# Patient Record
Sex: Female | Born: 1995 | Race: White | Hispanic: No | Marital: Single | Smoking: Never smoker
Health system: Southern US, Community
[De-identification: ages and names within clinical notes are randomized; demographics above are authoritative.]

## PROBLEM LIST (undated history)

## (undated) DIAGNOSIS — E119 Type 2 diabetes mellitus without complications: Secondary | ICD-10-CM

## (undated) DIAGNOSIS — E039 Hypothyroidism, unspecified: Secondary | ICD-10-CM

## (undated) HISTORY — DX: Type 2 diabetes mellitus without complications: E11.9

## (undated) SURGERY — Surgical Case
Anesthesia: *Unknown

---

## 2014-09-24 ENCOUNTER — Emergency Department
Admission: EM | Admit: 2014-09-24 | Discharge: 2014-09-24 | Disposition: A | Discharge status: Home / Self Care | Payer: 59 | Attending: Emergency Medicine | Admitting: Emergency Medicine

## 2014-09-24 ENCOUNTER — Encounter: Payer: Self-pay | Admitting: Emergency Medicine

## 2014-09-24 DIAGNOSIS — F10120 Alcohol abuse with intoxication, uncomplicated: Secondary | ICD-10-CM | POA: Insufficient documentation

## 2014-09-24 DIAGNOSIS — E119 Type 2 diabetes mellitus without complications: Secondary | ICD-10-CM | POA: Insufficient documentation

## 2014-09-24 DIAGNOSIS — Z794 Long term (current) use of insulin: Secondary | ICD-10-CM | POA: Insufficient documentation

## 2014-09-24 DIAGNOSIS — F10920 Alcohol use, unspecified with intoxication, uncomplicated: Secondary | ICD-10-CM

## 2014-09-24 HISTORY — DX: Type 2 diabetes mellitus without complications: E11.9

## 2014-09-24 LAB — ETHANOL: ALCOHOL ETHYL (B): 272 mg/dL — AB (ref <5)

## 2014-09-24 LAB — GLUCOSE, CAPILLARY: GLUCOSE-CAPILLARY: 176 mg/dL — AB (ref 65–99)

## 2014-09-24 MED ORDER — SODIUM CHLORIDE 0.9 % IV BOLUS (SEPSIS)
1000.0000 mL | Freq: Once | INTRAVENOUS | Status: AC
  Administered 2014-09-24: 1000 mL via INTRAVENOUS

## 2014-09-24 MED ORDER — ONDANSETRON HCL 4 MG/2ML IJ SOLN
4.0000 mg | Freq: Once | INTRAMUSCULAR | Status: AC
  Administered 2014-09-24: 4 mg via INTRAVENOUS
  Filled 2014-09-24: qty 2

## 2014-09-24 NOTE — Discharge Instructions (Signed)
Alcohol Intoxication  Alcohol intoxication occurs when the amount of alcohol that a person has consumed impairs his or her ability to mentally and physically function. Alcohol directly impairs the normal chemical activity of the brain. Drinking large amounts of alcohol can lead to changes in mental function and behavior, and it can cause many physical effects that can be harmful.   Alcohol intoxication can range in severity from mild to very severe. Various factors can affect the level of intoxication that occurs, such as the person's age, gender, weight, frequency of alcohol consumption, and the presence of other medical conditions (such as diabetes, seizures, or heart conditions). Dangerous levels of alcohol intoxication may occur when people drink large amounts of alcohol in a short period (binge drinking). Alcohol can also be especially dangerous when combined with certain prescription medicines or "recreational" drugs.  SIGNS AND SYMPTOMS  Some common signs and symptoms of mild alcohol intoxication include:  · Loss of coordination.  · Changes in mood and behavior.  · Impaired judgment.  · Slurred speech.  As alcohol intoxication progresses to more severe levels, other signs and symptoms will appear. These may include:  · Vomiting.  · Confusion and impaired memory.  · Slowed breathing.  · Seizures.  · Loss of consciousness.  DIAGNOSIS   Your health care provider will take a medical history and perform a physical exam. You will be asked about the amount and type of alcohol you have consumed. Blood tests will be done to measure the concentration of alcohol in your blood. In many places, your blood alcohol level must be lower than 80 mg/dL (0.08%) to legally drive. However, many dangerous effects of alcohol can occur at much lower levels.   TREATMENT   People with alcohol intoxication often do not require treatment. Most of the effects of alcohol intoxication are temporary, and they go away as the alcohol naturally  leaves the body. Your health care provider will monitor your condition until you are stable enough to go home. Fluids are sometimes given through an IV access tube to help prevent dehydration.   HOME CARE INSTRUCTIONS  · Do not drive after drinking alcohol.  · Stay hydrated. Drink enough water and fluids to keep your urine clear or pale yellow. Avoid caffeine.    · Only take over-the-counter or prescription medicines as directed by your health care provider.    SEEK MEDICAL CARE IF:   · You have persistent vomiting.    · You do not feel better after a few days.  · You have frequent alcohol intoxication. Your health care provider can help determine if you should see a substance use treatment counselor.  SEEK IMMEDIATE MEDICAL CARE IF:   · You become shaky or tremble when you try to stop drinking.    · You shake uncontrollably (seizure).    · You throw up (vomit) blood. This may be bright red or may look like black coffee grounds.    · You have blood in your stool. This may be bright red or may appear as a black, tarry, bad smelling stool.    · You become lightheaded or faint.    MAKE SURE YOU:   · Understand these instructions.  · Will watch your condition.  · Will get help right away if you are not doing well or get worse.  Document Released: 10/25/2004 Document Revised: 09/17/2012 Document Reviewed: 06/20/2012  Exit[employer]® Patient Information ©2015 [employer]. This information is not intended to replace advice given to you by your health care provider. Make sure   you discuss any questions you have with your health care provider.

## 2014-09-24 NOTE — ED Provider Notes (Signed)
[facility] Emergency Department Provider Note  ____________________________________________  Time seen: 1:30 AM  I have reviewed the triage vital signs and the nursing notes.   HISTORY  Chief Complaint Alcohol Intoxication      HPI Kristine Garrett is a 19 y.o. female presents via EMS clinically intoxicated. Per EMS patient was found on the side of the road by [place]. Patient admits to large alcohol intake tonight. Patient denies any trauma. Patient denies any illicit drug use. Patient had nonbloody emesis 3 while in route with EMS.    Past Medical History  Diagnosis Date  . Diabetes mellitus without complication     There are no active problems to display for this patient.   History reviewed. No pertinent past surgical history.  Current Outpatient Rx  Name  Route  Sig  Dispense  Refill  . insulin aspart (NOVOLOG) 100 UNIT/ML injection   Subcutaneous   Inject into the skin 4 (four) times daily - after meals and at bedtime.           Allergies No known drug allergies History reviewed. No pertinent family history.  Social History Social History  Substance Use Topics  . Smoking status: Never Smoker   . Smokeless tobacco: Never Used  . Alcohol Use: Yes    Review of Systems  Constitutional: Negative for fever. Eyes: Negative for visual changes. ENT: Negative for sore throat. Cardiovascular: Negative for chest pain. Respiratory: Negative for shortness of breath. Gastrointestinal: Negative for abdominal pain, vomiting and diarrhea. Genitourinary: Negative for dysuria. Musculoskeletal: Negative for back pain. Skin: Negative for rash. Neurological: Negative for headaches, focal weakness or numbness.   10-point ROS otherwise negative.  ____________________________________________   PHYSICAL EXAM:  VITAL SIGNS: ED Triage Vitals  Enc Vitals Group     BP 09/24/14 0127 93/63 mmHg     Pulse Rate 09/24/14 0127 70     Resp 09/24/14  0127 18     Temp 09/24/14 0127 97.4 F (36.3 C)     Temp Source 09/24/14 0127 Axillary     SpO2 09/24/14 0127 97 %     Weight 09/24/14 0127 150 lb (68.04 kg)     Height 09/24/14 0127  (1.702 m)     Head Cir --      Peak Flow --      Pain Score 09/24/14 0128 0     Pain Loc --      Pain Edu? --      Excl. in GC? --     Constitutional: Alert, alcohol on breath nonbloody emesis noted in hair Eyes: Conjunctivae are normal. PERRL. Normal extraocular movements. ENT   Head: Normocephalic and atraumatic.   Nose: No congestion/rhinnorhea.   Mouth/Throat: Mucous membranes are moist.   Neck: No stridor. Cardiovascular: Normal rate, regular rhythm. Normal and symmetric distal pulses are present in all extremities. No murmurs, rubs, or gallops. Respiratory: Normal respiratory effort without tachypnea nor retractions. Breath sounds are clear and equal bilaterally. No wheezes/rales/rhonchi. Gastrointestinal: Soft and nontender. No distention. There is no CVA tenderness. Genitourinary: deferred Musculoskeletal: Nontender with normal range of motion in all extremities. No joint effusions.  No lower extremity tenderness nor edema. Neurologic:  Normal speech and language. No gross focal neurologic deficits are appreciated. Speech is normal.  Skin:  Skin is warm, dry and intact. No rash noted. Psychiatric: Clinically intoxicated.  ____________________________________________    LABS (pertinent positives/negatives)  Labs Reviewed  ETHANOL - Abnormal; Notable for the following:  Alcohol, Ethyl (B) 272 (*)    All other components within normal limits       INITIAL IMPRESSION / ASSESSMENT AND PLAN / ED COURSE  Pertinent labs & imaging results that were available during my care of the patient were reviewed by me and considered in my medical decision making (see chart for details).  I reevaluated the patient and again asked if there was any trauma any assault to which the  patient denied no physical evidence of assault during my examination. I asked patient if she wanted me to inform her family that she was in the emergency department to which she refused my offer to give the patient a phone so she could call her parents and let them know she was in the emergency department to which she also refused.  ____________________________________________   FINAL CLINICAL IMPRESSION(S) / ED DIAGNOSES  Final diagnoses:  Alcohol intoxication, uncomplicated      Darci Current, MD 09/24/14 [phone]

## 2014-09-24 NOTE — ED Notes (Signed)
Pt seen walking with friends from [place] in parking lot.  Pt informed not to drive.

## 2014-09-24 NOTE — ED Notes (Signed)
Pt to [facility] via EMS, report alcohol intoxication with vomiting x 3.  Pt reports ID-DM.  EMS report VS stable en route.  Pt lethargic but responsive to stimuli.

## 2014-09-24 NOTE — ED Notes (Signed)
Dr Mayford Knife at bedside, cleared pt to be DC, pt A&O x4, amb w/o assist. Pt walked to lobby w/o diff.

## 2015-03-05 DIAGNOSIS — M79651 Pain in right thigh: Secondary | ICD-10-CM | POA: Diagnosis not present

## 2015-03-05 DIAGNOSIS — M545 Low back pain: Secondary | ICD-10-CM | POA: Diagnosis not present

## 2015-03-05 DIAGNOSIS — M79652 Pain in left thigh: Secondary | ICD-10-CM | POA: Diagnosis not present

## 2015-03-08 ENCOUNTER — Ambulatory Visit
Admission: RE | Admit: 2015-03-08 | Discharge: 2015-03-08 | Disposition: A | Discharge status: Home / Self Care | Payer: BLUE CROSS/BLUE SHIELD | Source: Ambulatory Visit | Attending: Family Medicine | Admitting: Family Medicine

## 2015-03-08 ENCOUNTER — Other Ambulatory Visit: Payer: Self-pay | Admitting: Family Medicine

## 2015-03-08 DIAGNOSIS — R52 Pain, unspecified: Secondary | ICD-10-CM

## 2015-03-08 DIAGNOSIS — G729 Myopathy, unspecified: Secondary | ICD-10-CM | POA: Diagnosis present

## 2015-03-08 DIAGNOSIS — Z9641 Presence of insulin pump (external) (internal): Secondary | ICD-10-CM | POA: Insufficient documentation

## 2015-03-08 DIAGNOSIS — M545 Low back pain: Secondary | ICD-10-CM | POA: Insufficient documentation

## 2015-03-08 DIAGNOSIS — M6289 Other specified disorders of muscle: Secondary | ICD-10-CM

## 2015-03-14 ENCOUNTER — Other Ambulatory Visit: Payer: Self-pay | Admitting: Family Medicine

## 2015-03-14 DIAGNOSIS — M545 Low back pain: Secondary | ICD-10-CM

## 2015-03-16 ENCOUNTER — Ambulatory Visit
Admission: RE | Admit: 2015-03-16 | Discharge: 2015-03-16 | Disposition: A | Discharge status: Home / Self Care | Payer: BLUE CROSS/BLUE SHIELD | Source: Ambulatory Visit | Attending: Family Medicine | Admitting: Family Medicine

## 2015-03-16 DIAGNOSIS — M5137 Other intervertebral disc degeneration, lumbosacral region: Secondary | ICD-10-CM | POA: Insufficient documentation

## 2015-03-16 DIAGNOSIS — M5440 Lumbago with sciatica, unspecified side: Secondary | ICD-10-CM | POA: Diagnosis present

## 2015-03-16 DIAGNOSIS — M545 Low back pain: Secondary | ICD-10-CM | POA: Diagnosis present

## 2015-03-30 ENCOUNTER — Ambulatory Visit: Payer: 59

## 2015-04-28 ENCOUNTER — Other Ambulatory Visit: Payer: Self-pay | Admitting: Family Medicine

## 2015-04-28 ENCOUNTER — Ambulatory Visit
Admission: RE | Admit: 2015-04-28 | Discharge: 2015-04-28 | Disposition: A | Discharge status: Home / Self Care | Payer: BLUE CROSS/BLUE SHIELD | Source: Ambulatory Visit | Attending: Family Medicine | Admitting: Family Medicine

## 2015-04-28 DIAGNOSIS — M25551 Pain in right hip: Secondary | ICD-10-CM | POA: Diagnosis not present

## 2015-04-28 DIAGNOSIS — R52 Pain, unspecified: Secondary | ICD-10-CM

## 2015-05-03 ENCOUNTER — Encounter: Payer: Self-pay | Admitting: Family Medicine

## 2015-05-03 ENCOUNTER — Ambulatory Visit (INDEPENDENT_AMBULATORY_CARE_PROVIDER_SITE_OTHER): Payer: BLUE CROSS/BLUE SHIELD | Admitting: Family Medicine

## 2015-05-03 DIAGNOSIS — M25551 Pain in right hip: Secondary | ICD-10-CM | POA: Diagnosis not present

## 2015-05-03 NOTE — Progress Notes (Signed)
Patient ID: Kristine Garrett, female   DOB: 04/07/1995, 20 y.o.   MRN: [id number]  She presents today with symptoms of right hip pain. Patient states that she has noticed this pain for the last week. Most of her pain is with running. Patient also has had some chronic back pain which is better now than when it first started in January. Her MRI of her lower back was not significant for any large disc herniation. Patient has a history of a stress injury to both hips in the past. X-rays of the hip do not show any obvious stress fracture. 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 She denies any pain with walking or at rest. She states that the symptoms that she is having in her hip at this time are similar to when she had her stress injury a few years ago. Patient is also a type I diabetic and has hypothyroidism. She states her blood sugars are under well control and her last A1c was 7.5. She denies any restrictive diet. She does not know her vitamin D status. She admits to irregular menstrual cycles.  ROS: Negative except mentioned above.  Vitals as per Epic. GENERAL: NAD RESP: CTA B CARD: RRR MSK: no tenderness on palpation of the groin/hip, patient points to right inquinal area extending into proximal femur, limited external rotation bilaterally, no pain with internal rotation, negative log roll, mild tenderness right lower lumbar paravertebral area, normal flexion, mild discomfort with extension, -SLR, +R Fulcrum, FROM of LEs, nv intact  NEURO: CN II-XII grossly intact   A/P: R Hip Pain-given patient's symptoms and history would recommend proceeding with MRI of the right hip. I have asked the patient to avoid any activity that causes her pain. If any worsening of symptoms she is to let her trainer know. X-rays of her hip were reviewed with her in the office today. Of note there was evidence of osteitis pubis however patient has no pain in this area. Will discuss plan of  care after MRI has been done. Vitamin D level and calcium level were checked today.

## 2015-05-03 NOTE — Addendum Note (Signed)
Addended by: Dione HousekeeperPATEL, Shaddai Shapley N on: 05/03/2015 10:11 AM   Modules accepted: Orders

## 2015-05-04 LAB — SPECIMEN STATUS

## 2015-05-04 LAB — BASIC METABOLIC PANEL
BUN/Creatinine Ratio: 21 (ref 9–23)
BUN: 14 mg/dL (ref 6–20)
CALCIUM: 9.5 mg/dL (ref 8.7–10.2)
CO2: 21 mmol/L (ref 18–29)
Chloride: 102 mmol/L (ref 96–106)
Creatinine, Ser: 0.66 mg/dL (ref 0.57–1.00)
GFR, EST AFRICAN AMERICAN: 147 mL/min/{1.73_m2} (ref >59)
GFR, EST NON AFRICAN AMERICAN: 128 mL/min/{1.73_m2} (ref >59)
Glucose: 78 mg/dL (ref 65–99)
Potassium: 4.5 mmol/L (ref 3.5–5.2)
Sodium: 142 mmol/L (ref 134–144)

## 2015-05-04 LAB — VITAMIN D 25 HYDROXY (VIT D DEFICIENCY, FRACTURES): Vit D, 25-Hydroxy: 46 ng/mL (ref 30.0–100.0)

## 2015-05-05 ENCOUNTER — Other Ambulatory Visit: Payer: Self-pay | Admitting: Family Medicine

## 2015-05-05 DIAGNOSIS — M25551 Pain in right hip: Secondary | ICD-10-CM

## 2015-05-20 ENCOUNTER — Ambulatory Visit: Payer: BLUE CROSS/BLUE SHIELD

## 2015-05-23 ENCOUNTER — Ambulatory Visit
Admission: RE | Admit: 2015-05-23 | Discharge: 2015-05-23 | Disposition: A | Discharge status: Home / Self Care | Payer: BLUE CROSS/BLUE SHIELD | Source: Ambulatory Visit | Attending: Family Medicine | Admitting: Family Medicine

## 2015-05-23 DIAGNOSIS — M25551 Pain in right hip: Secondary | ICD-10-CM | POA: Insufficient documentation

## 2015-10-10 ENCOUNTER — Ambulatory Visit (INDEPENDENT_AMBULATORY_CARE_PROVIDER_SITE_OTHER): Payer: BLUE CROSS/BLUE SHIELD | Admitting: Family Medicine

## 2015-10-10 ENCOUNTER — Encounter: Payer: Self-pay | Admitting: Family Medicine

## 2015-10-10 DIAGNOSIS — M79606 Pain in leg, unspecified: Secondary | ICD-10-CM

## 2015-10-10 NOTE — Progress Notes (Signed)
Patient presents today for follow-up regarding her tightness in bilateral quadriceps. Patient has had the symptoms for the last few years. Her symptoms have gotten worse while she has been in [employer]. Workup has been done to see if the etiology of her pain is from her lower back. She is also seen Dr. Ardine Engiehl her orthopedic surgeon. There was some thought over the summer her that we should test her compartment pressures to see if she has an exertional compartment syndrome. I did speak with Dr. Wyn Quakerew the vascular surgeon at [facility] who was willing to do compartment pressures on her. Patient has never had to take herself out of practice or a game due to her quadriceps tightness. She has not had any tingling numbness or discoloration of her quadriceps. Patient does understand that if there are elevated pressures in the quadriceps that a fasciotomy could be done to help relieve her symptoms but there is no guarantee that this would not cause any other complications or would alleviate fully her symptoms. Patient would just like to know the reason for why she has the tightness in her quadriceps. The tightness does not occur outside of running. Will set up appointment for patient to see Dr. Wyn Quakerew for an evaluation and to set up a appointment to have compartment pressures measured.

## 2015-10-14 ENCOUNTER — Other Ambulatory Visit: Payer: Self-pay | Admitting: Family Medicine

## 2015-10-14 DIAGNOSIS — M79659 Pain in unspecified thigh: Secondary | ICD-10-CM

## 2015-11-03 ENCOUNTER — Encounter (INDEPENDENT_AMBULATORY_CARE_PROVIDER_SITE_OTHER): Payer: Self-pay

## 2015-11-07 ENCOUNTER — Other Ambulatory Visit (INDEPENDENT_AMBULATORY_CARE_PROVIDER_SITE_OTHER): Payer: Self-pay | Admitting: Vascular Surgery

## 2015-11-08 ENCOUNTER — Other Ambulatory Visit (INDEPENDENT_AMBULATORY_CARE_PROVIDER_SITE_OTHER): Payer: Self-pay | Admitting: Vascular Surgery

## 2015-11-14 ENCOUNTER — Ambulatory Visit
Admission: RE | Admit: 2015-11-14 | Discharge: 2015-11-14 | Disposition: A | Discharge status: Home / Self Care | Payer: BLUE CROSS/BLUE SHIELD | Source: Ambulatory Visit | Attending: Vascular Surgery | Admitting: Vascular Surgery

## 2015-11-14 ENCOUNTER — Encounter
Admission: RE | Disposition: A | Discharge status: Home / Self Care | Payer: Self-pay | Source: Ambulatory Visit | Attending: Vascular Surgery

## 2015-11-14 ENCOUNTER — Encounter: Payer: Self-pay | Admitting: *Deleted

## 2015-11-14 DIAGNOSIS — M79604 Pain in right leg: Secondary | ICD-10-CM

## 2015-11-14 DIAGNOSIS — M79652 Pain in left thigh: Secondary | ICD-10-CM | POA: Insufficient documentation

## 2015-11-14 DIAGNOSIS — E039 Hypothyroidism, unspecified: Secondary | ICD-10-CM | POA: Diagnosis not present

## 2015-11-14 DIAGNOSIS — Z794 Long term (current) use of insulin: Secondary | ICD-10-CM | POA: Diagnosis not present

## 2015-11-14 DIAGNOSIS — E119 Type 2 diabetes mellitus without complications: Secondary | ICD-10-CM | POA: Insufficient documentation

## 2015-11-14 DIAGNOSIS — M79651 Pain in right thigh: Secondary | ICD-10-CM | POA: Insufficient documentation

## 2015-11-14 DIAGNOSIS — M79605 Pain in left leg: Secondary | ICD-10-CM

## 2015-11-14 HISTORY — PX: PERIPHERAL VASCULAR CATHETERIZATION: SHX172C

## 2015-11-14 HISTORY — DX: Hypothyroidism, unspecified: E03.9

## 2015-11-14 SURGERY — LOWER EXTREMITY VENOGRAPHY
Anesthesia: Moderate Sedation

## 2015-11-14 MED ORDER — LIDOCAINE HCL (PF) 1 % IJ SOLN
INTRAMUSCULAR | Status: AC
Start: 2015-11-14 — End: 2015-11-14
  Filled 2015-11-14: qty 30

## 2015-11-14 SURGICAL SUPPLY — 2 items
KIT RIGHT HEART (MISCELLANEOUS) ×3 IMPLANT
NEEDLE COAXIAL INTRO 17GX11.8 (NEEDLE) ×6 IMPLANT

## 2015-11-14 NOTE — Op Note (Signed)
[facility]   OPERATIVE NOTE  DATE: 11/14/2015  PRE-OPERATIVE DIAGNOSIS: Bilateral lower extremity pain worrisome for thigh compartment syndrome  POST-OPERATIVE DIAGNOSIS: Same as above  PROCEDURE: 1.   Bilateral thigh compartment pressure measurements with and without exercise  SURGEON: Festus BarrenJason Asma Boldon  ASSISTANT(S): None  ANESTHESIA: Local  ESTIMATED BLOOD LOSS: Minimal  FINDING(S): 1.  Right thigh pressure measurements were 2 without exercise and went up to 7 with exercise. Left thigh pressure measurements were 1-2 without exercise and went up to 8 with exercise.  SPECIMEN(S):  None  INDICATIONS:   Kristine NovasShelby Gassert is a 20 y.o. female who presents with bilateral lower extremity pain worrisome for compartment syndrome of the thigh. The pain develops at vigorous activity and is generally relieved with rest. She has had other imaging studies and evaluation and no other clear cause of her pain has been found, and she was referred for compartment pressure measurements for further evaluation. Risks and benefits were discussed and informed consent was obtained.  DESCRIPTION: After obtaining full informed written consent, the patient was brought back to the operating room and placed supine upon the vascular suite table.  The patient received IV antibiotics prior to induction.  After obtaining adequate anesthesia, the patient was prepped and draped in the standard fashion. I anesthetized an area over the anterior compartment laterally to measure the pressures bilaterally. We then used the 17-gauge coaxial introducer needle to measure pressures in the anterior compartment of the thigh. The right thigh was measured first in the left thigh was measured second. The patient then went and exercise vigorously on the treadmill at a rapid pace with the steep incline until the pain developed and continued exercising for another 60-90 seconds. Pressures were then measured in the thigh  again for further evaluation post exercise to evaluate for compartment syndrome. Right thigh pressure measurements were 2 without exercise and went up to 7 with exercise. Left thigh pressure measurements were 1-2 without exercise and went up to 8 with exercise. This would be a typical response and not consistent with compartment syndrome pressures in either lower extremity. The patient was then taken to the recovery room in stable condition having tolerated the procedure well.  COMPLICATIONS: None  CONDITION: Stable  Festus BarrenJason Rebeka Kimble  11/14/2015, 9:47 AM    This note was created with Dragon Medical transcription system. Any errors in dictation are purely unintentional.

## 2015-11-14 NOTE — H&P (Signed)
[facility] History & Physical Update  The patient was interviewed and re-examined.  The patient's previous History and Physical has been reviewed and is unchanged.  There is no change in the plan of care. We plan to proceed with the scheduled procedure.  Festus BarrenJason Tanairi Cypert, MD  11/14/2015, 8:25 AM

## 2015-11-15 ENCOUNTER — Encounter: Payer: Self-pay | Admitting: Vascular Surgery

## 2017-01-15 IMAGING — MR MR LUMBAR SPINE W/O CM
5 series · 38 of 48 positions shown · non-contrast
Comparison: Lumbar radiographs 03/08/2015

CLINICAL DATA: 19-year-old female with increasing lumbar back pain
for 2 months with numbness and tingling in both legs. No known
injury. Subsequent encounter.

EXAM:
MRI LUMBAR SPINE WITHOUT CONTRAST
TECHNIQUE: Multiplanar, multisequence MR imaging of the lumbar spine was
performed. No intravenous contrast was administered.

[Series 2: T2 · sagittal · 4.0mm · 0.81mm/px · 6 of 16 slices shown (1 of 2)]
[im 1/16]
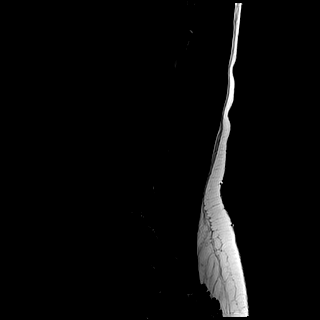
[im 4/16]
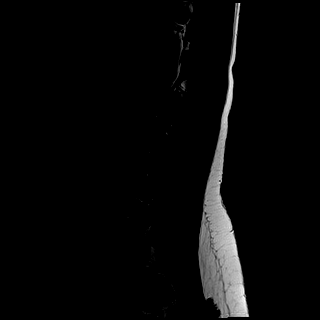
[im 7/16]
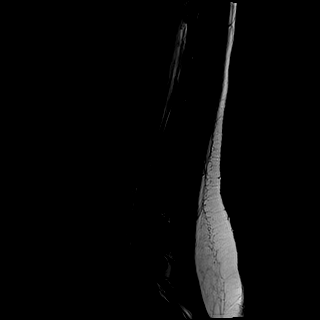
[im 10/16]
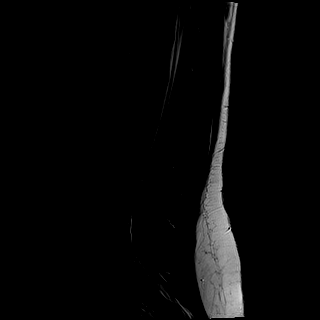
[im 13/16]
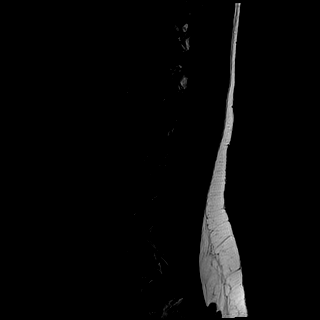
[im 16/16]
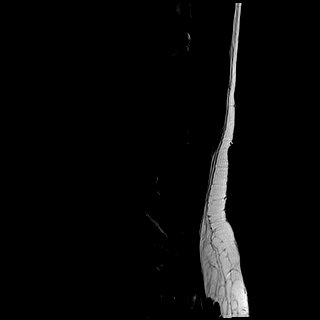

[Series 3: T1 · sagittal · 4.0mm · 0.81mm/px · 6 of 16 slices shown (1 of 2)]
[im 1/16]
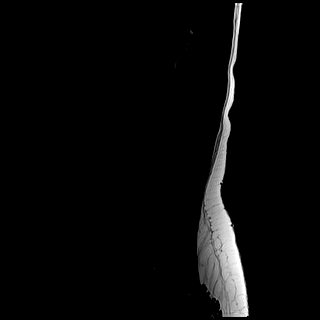
[im 4/16]
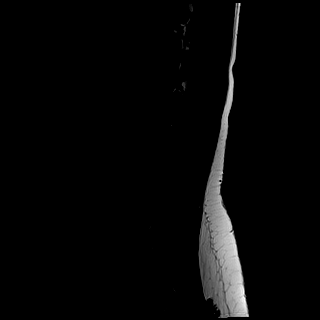
[im 7/16]
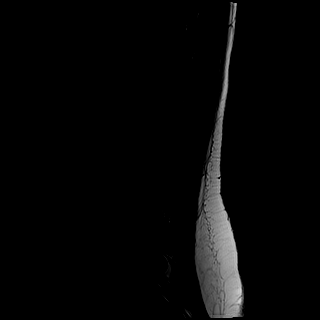
[im 10/16]
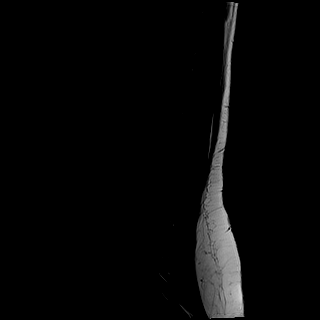
[im 13/16]
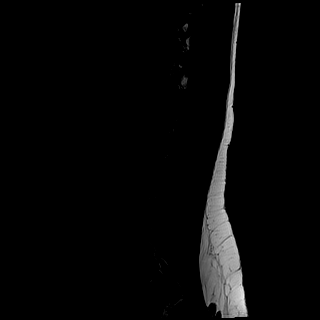
[im 16/16]
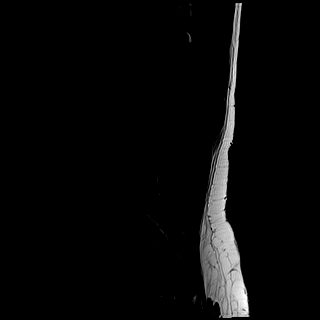

[Series 4: STIR · sagittal · 4.0mm · 1.02mm/px · 6 of 16 slices shown]
[im 1/16]
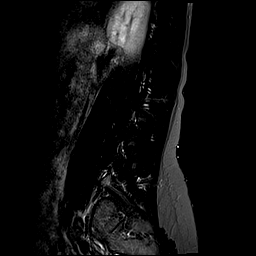
[im 4/16]
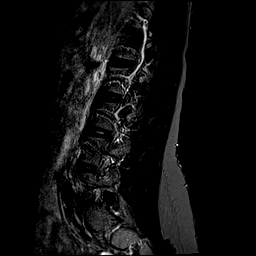
[im 7/16]
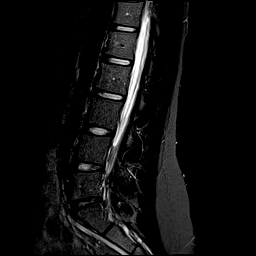
[im 10/16]
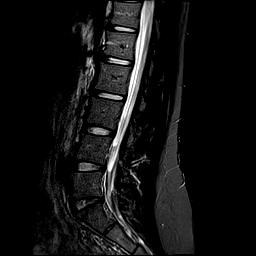
[im 13/16]
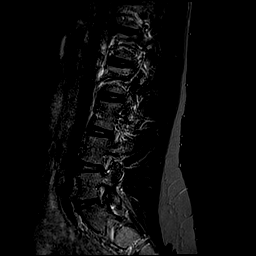
[im 16/16]
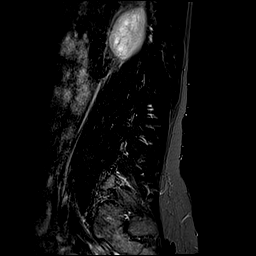

[Series 5: T2 · axial · 4.0mm · 0.78mm/px · z∈[-146,+71]mm · 11 of 42 slices shown (2 of 2)]
[im 1/42]
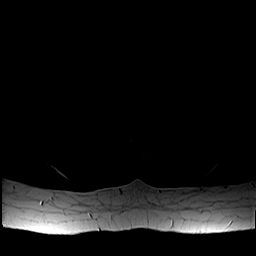
[im 3/42]
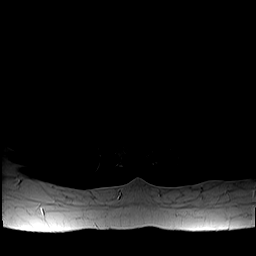
[im 6/42]
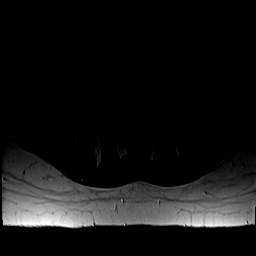
[im 9/42]
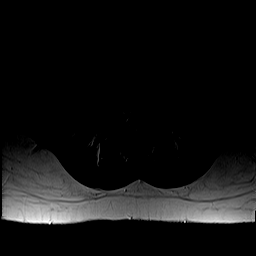
[im 12/42]
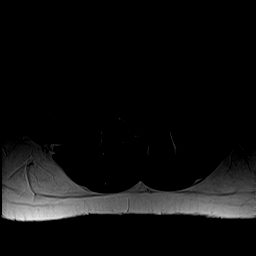
[im 18/42]
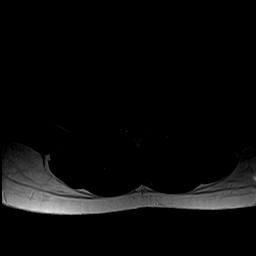
[im 21/42]
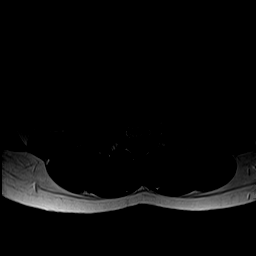
[im 24/42]
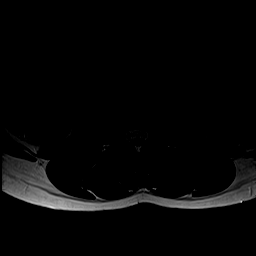
[im 30/42]
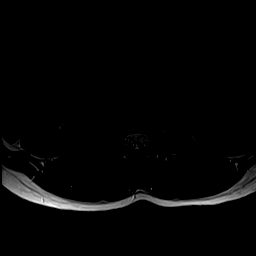
[im 36/42]
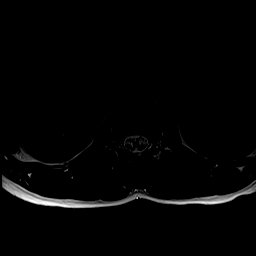
[im 42/42]
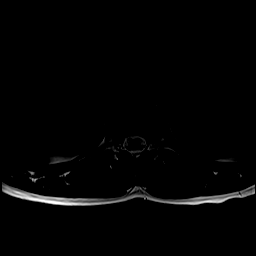

[Series 6: T1 · axial · 4.0mm · 0.39mm/px · z∈[-146,+71]mm · 9 of 42 slices shown (2 of 2)]
[im 1/42]
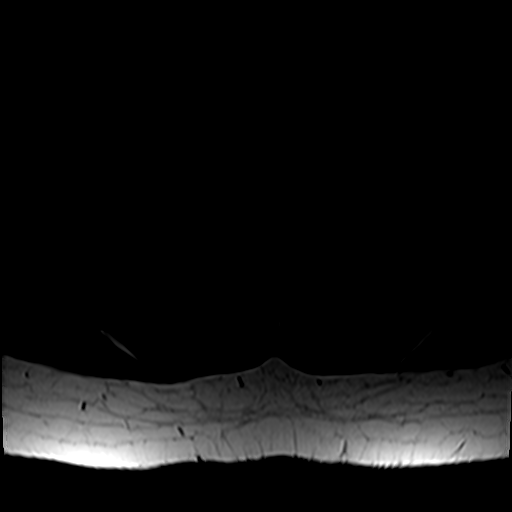
[im 6/42]
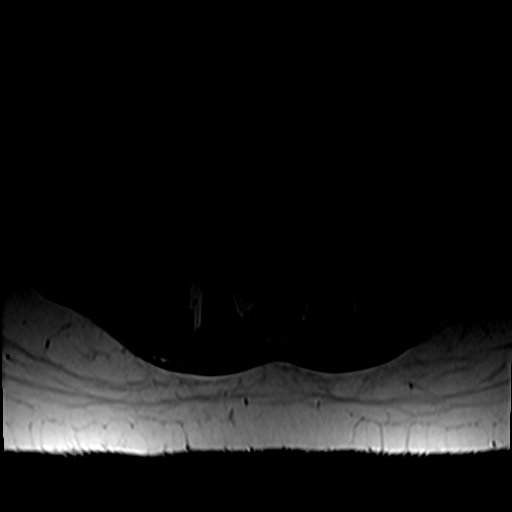
[im 12/42]
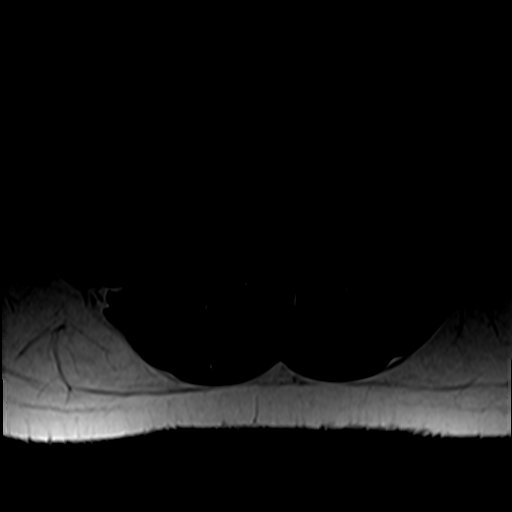
[im 18/42]
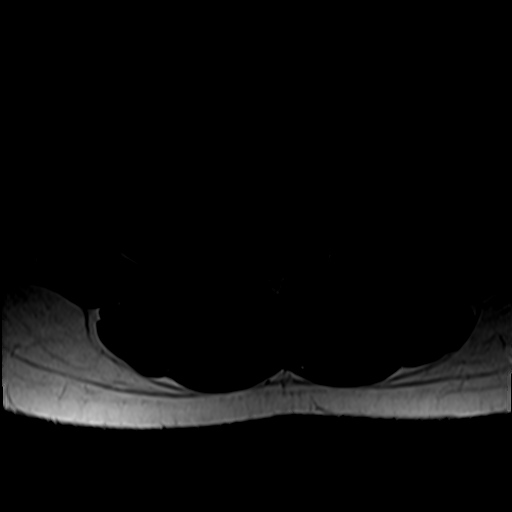
[im 21/42]
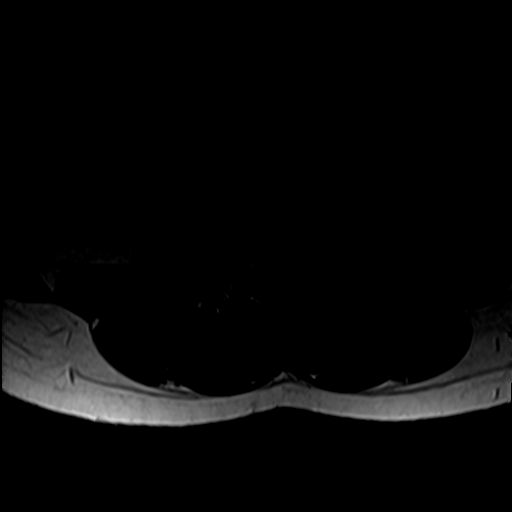
[im 24/42]
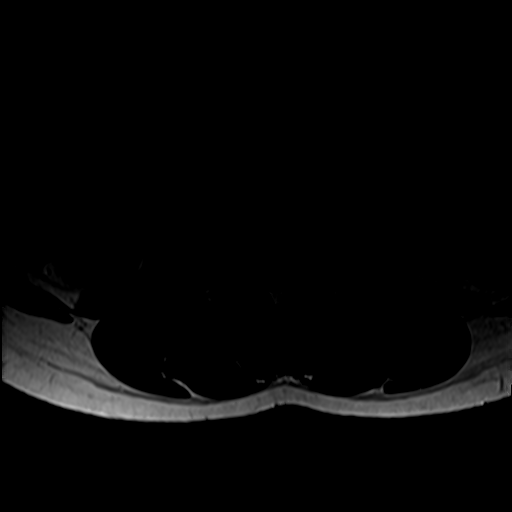
[im 30/42]
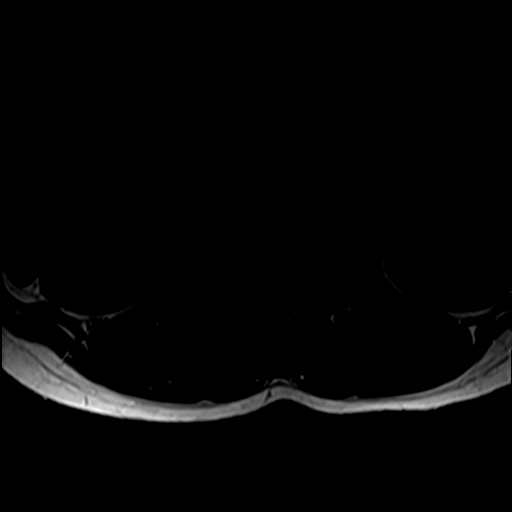
[im 36/42]
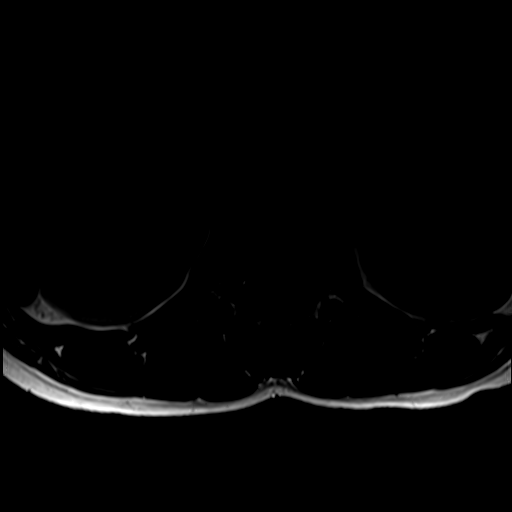
[im 42/42]
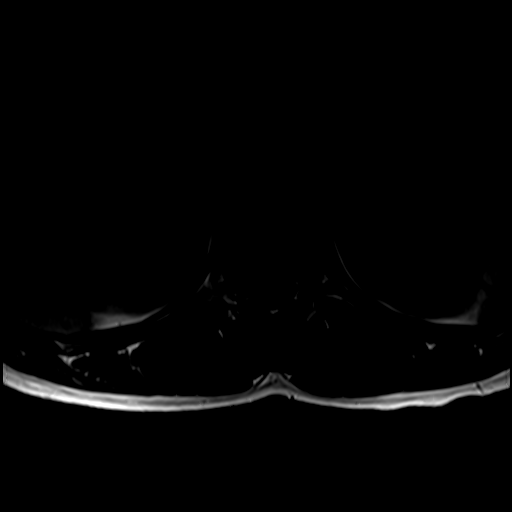

[38 of 48 positions shown; findings below may reference images not displayed]

FINDINGS: Normal lumbar segmentation demonstrated on the radiographs with
stable vertebral height and alignment. There is straightening of
lumbar lordosis, and trace retrolisthesis at L5-S1. No marrow edema
or evidence of acute osseous abnormality.

Visualized lower thoracic spinal cord is normal with conus medularis
at L1.

Visualized abdominal viscera and paraspinal soft tissues are within
normal limits.

T11-T12: Partially visible, grossly negative.

T12-L1:  Negative.

L1-L2:  Negative.

L2-L3:  Negative.

L3-L4:  Negative.

L4-L5:  Negative.

L5-S1: Minimal disc desiccation. No disc herniation. Mild epidural
lipomatosis. Borderline to mild facet hypertrophy greater on the
left. No stenosis.
IMPRESSION: Largely unremarkable MRI appearance of the lumbar spine. Minimal
disc degeneration at L5-S1 with no associated disc herniation,
spinal stenosis, and no neural impingement identified.
